# Patient Record
Sex: Female | Born: 1940 | Race: White | Hispanic: No | Smoking: Never smoker
Health system: Southern US, Community
[De-identification: ages and names within clinical notes are randomized; demographics above are authoritative.]

## PROBLEM LIST (undated history)

## (undated) DIAGNOSIS — I10 Essential (primary) hypertension: Secondary | ICD-10-CM

## (undated) DIAGNOSIS — K219 Gastro-esophageal reflux disease without esophagitis: Secondary | ICD-10-CM

## (undated) DIAGNOSIS — E079 Disorder of thyroid, unspecified: Secondary | ICD-10-CM

## (undated) HISTORY — PX: ABDOMINAL HYSTERECTOMY: SHX81

## (undated) HISTORY — PX: ABDOMINAL SURGERY: SHX537

## (undated) HISTORY — PX: APPENDECTOMY: SHX54

## (undated) HISTORY — PX: COLON SURGERY: SHX602

---

## 2011-02-11 ENCOUNTER — Encounter (HOSPITAL_BASED_OUTPATIENT_CLINIC_OR_DEPARTMENT_OTHER): Payer: Self-pay | Admitting: *Deleted

## 2011-02-11 ENCOUNTER — Emergency Department (INDEPENDENT_AMBULATORY_CARE_PROVIDER_SITE_OTHER): Payer: Medicare Other

## 2011-02-11 ENCOUNTER — Emergency Department (HOSPITAL_BASED_OUTPATIENT_CLINIC_OR_DEPARTMENT_OTHER)
Admission: EM | Admit: 2011-02-11 | Discharge: 2011-02-11 | Disposition: A | Discharge status: Home / Self Care | Payer: Medicare Other | Attending: Emergency Medicine | Admitting: Emergency Medicine

## 2011-02-11 DIAGNOSIS — K219 Gastro-esophageal reflux disease without esophagitis: Secondary | ICD-10-CM | POA: Insufficient documentation

## 2011-02-11 DIAGNOSIS — X58XXXA Exposure to other specified factors, initial encounter: Secondary | ICD-10-CM

## 2011-02-11 DIAGNOSIS — IMO0001 Reserved for inherently not codable concepts without codable children: Secondary | ICD-10-CM

## 2011-02-11 DIAGNOSIS — M7989 Other specified soft tissue disorders: Secondary | ICD-10-CM

## 2011-02-11 DIAGNOSIS — M20019 Mallet finger of unspecified finger(s): Secondary | ICD-10-CM | POA: Insufficient documentation

## 2011-02-11 DIAGNOSIS — I10 Essential (primary) hypertension: Secondary | ICD-10-CM | POA: Insufficient documentation

## 2011-02-11 DIAGNOSIS — S62639A Displaced fracture of distal phalanx of unspecified finger, initial encounter for closed fracture: Secondary | ICD-10-CM

## 2011-02-11 HISTORY — DX: Gastro-esophageal reflux disease without esophagitis: K21.9

## 2011-02-11 HISTORY — DX: Essential (primary) hypertension: I10

## 2011-02-11 NOTE — ED Provider Notes (Addendum)
History     CSN: [id number]  Arrival date & time 02/11/11  2018   First MD Initiated Contact with Patient 02/11/11 2027      Chief Complaint  Patient presents with  . Finger Injury    (Consider location/radiation/quality/duration/timing/severity/associated sxs/prior treatment) HPI Complains of pain at left ring finger,DIP joint occurred one hour ago when your became twisted when the wind blew while she was carrying a trash can. Pain is moderate worse with movement or palpation nonradiating no other injury no other associated symptoms treated with Advil with partial relief Past Medical History  Diagnosis Date  . Hypertension   . GERD (gastroesophageal reflux disease)    Hypothyroid Past Surgical History  Procedure Date  . Abdominal surgery   . Abdominal hysterectomy   . Appendectomy     History reviewed. No pertinent family history.  History  Substance Use Topics  . Smoking status: Never Smoker   . Smokeless tobacco: Not on file  . Alcohol Use: No    OB History    Grav Para Term Preterm Abortions TAB SAB Ect Mult Living                  Review of Systems  Constitutional: Negative.   Musculoskeletal: Positive for back pain.       Chronic back pain, pain at DIP joint  Skin: Negative.   Neurological: Negative.     Allergies  Review of patient's allergies indicates no known allergies.  Home Medications   Current Outpatient Rx  Name Route Sig Dispense Refill  . FUROSEMIDE 40 MG PO TABS Oral Take 40 mg by mouth daily.    Marland Kitchen LEVOTHYROXINE SODIUM 150 MCG PO TABS Oral Take 150 mcg by mouth daily.    Marland Kitchen METOPROLOL TARTRATE 25 MG PO TABS Oral Take 25 mg by mouth 2 (two) times daily.      BP 152/76  Pulse 78  Temp(Src) 99.6 F (37.6 C) (Oral)  Resp 16  Ht 5\' 6"  (1.676 m)  Wt 200 lb (90.719 kg)  BMI 32.28 kg/m2  SpO2 100%  Physical Exam  Nursing note and vitals reviewed. Constitutional: She appears well-developed and well-nourished. No distress.  HENT:    Head: Normocephalic and atraumatic.  Eyes: EOM are normal.  Neck: Neck supple.  Cardiovascular: Normal rate.   Pulmonary/Chest: Effort normal.  Abdominal: She exhibits no distension.  Musculoskeletal:       Left upper extremity: Ring finger with mallet deformity, otherwise atraumatic skin intact all fingers with good capillary refill, neurovascularly intact; all other extremities atraumatic neurovascularly intact    ED Course  Procedures (including critical care time) Declines pain medicine in the ED, or rx for pain med Labs Reviewed - No data to display No results found.  DIP joint splinted in extension by [employer]. Splint is comfortable for patient No diagnosis found.  No results found for this or any previous visit. Dg Finger Ring Left  02/11/2011  *RADIOLOGY REPORT*  Clinical Data: Question injury of the finger.  LEFT RING FINGER 2+V  Comparison: None.  Findings: There is an avulsion of the dorsal base of the terminal phalanx of the ring finger compatible with mallet finger injury. Soft tissue swelling is present over the distal aspect of the ring finger.  Fracture is displaced little over 1 mm.  IMPRESSION: Dorsal base of the terminal phalanx fracture compatible with mallet finger.  Original Report Authenticated By: Andreas Newport, M.D.     MDM  Dr. Melvyn Novas contacted. Plan followup  office next week Diagnosis mallet finger left fourth ring finger with fracture        Doug Sou, MD 02/11/11 2103  Doug Sou, MD 02/11/11 2103

## 2011-02-11 NOTE — ED Notes (Signed)
I placed a call for ortho (hand) per Dr. Ethelda Chick, Dr. Orlan Leavens is on call.

## 2011-02-11 NOTE — ED Notes (Signed)
Pt c/o left ring finger injury x 1 hr

## 2019-03-22 ENCOUNTER — Emergency Department (HOSPITAL_BASED_OUTPATIENT_CLINIC_OR_DEPARTMENT_OTHER): Payer: Medicare Other

## 2019-03-22 ENCOUNTER — Other Ambulatory Visit: Payer: Self-pay

## 2019-03-22 ENCOUNTER — Emergency Department (HOSPITAL_BASED_OUTPATIENT_CLINIC_OR_DEPARTMENT_OTHER)
Admission: EM | Admit: 2019-03-22 | Discharge: 2019-03-22 | Disposition: A | Discharge status: Home / Self Care | Payer: Medicare Other | Attending: Emergency Medicine | Admitting: Emergency Medicine

## 2019-03-22 DIAGNOSIS — S01111A Laceration without foreign body of right eyelid and periocular area, initial encounter: Secondary | ICD-10-CM | POA: Diagnosis not present

## 2019-03-22 DIAGNOSIS — Z7982 Long term (current) use of aspirin: Secondary | ICD-10-CM | POA: Diagnosis not present

## 2019-03-22 DIAGNOSIS — Y999 Unspecified external cause status: Secondary | ICD-10-CM | POA: Diagnosis not present

## 2019-03-22 DIAGNOSIS — Y92009 Unspecified place in unspecified non-institutional (private) residence as the place of occurrence of the external cause: Secondary | ICD-10-CM | POA: Diagnosis not present

## 2019-03-22 DIAGNOSIS — Y9301 Activity, walking, marching and hiking: Secondary | ICD-10-CM | POA: Insufficient documentation

## 2019-03-22 DIAGNOSIS — Z79899 Other long term (current) drug therapy: Secondary | ICD-10-CM | POA: Insufficient documentation

## 2019-03-22 DIAGNOSIS — H05231 Hemorrhage of right orbit: Secondary | ICD-10-CM

## 2019-03-22 DIAGNOSIS — W01190A Fall on same level from slipping, tripping and stumbling with subsequent striking against furniture, initial encounter: Secondary | ICD-10-CM | POA: Diagnosis not present

## 2019-03-22 DIAGNOSIS — W19XXXA Unspecified fall, initial encounter: Secondary | ICD-10-CM

## 2019-03-22 MED ORDER — ACETAMINOPHEN 325 MG PO TABS
650.0000 mg | ORAL_TABLET | Freq: Once | ORAL | Status: AC
  Administered 2019-03-22: 650 mg via ORAL
  Filled 2019-03-22: qty 2

## 2019-03-22 MED ORDER — LIDOCAINE-EPINEPHRINE (PF) 2 %-1:200000 IJ SOLN
10.0000 mL | Freq: Once | INTRAMUSCULAR | Status: AC
  Administered 2019-03-22: 10 mL
  Filled 2019-03-22: qty 10

## 2019-03-22 MED ORDER — HYDROCODONE-ACETAMINOPHEN 5-325 MG PO TABS: 1.0000 | ORAL_TABLET | ORAL | 0 refills | Status: DC | PRN

## 2019-03-22 NOTE — ED Triage Notes (Signed)
Per EMS:  Pt tripped and fell, hit corner of table to right eye.  Denies loc.  Not on blood thinners but takes aspirin.  Pt did admit to some back pain but is baseline to patient.  Pt did not admit to any other injury.  Pt has some swelling to right eye and bruising.  Pt also has laceration over right eye, bleeding controlled.  Denies blurry vision, denies dizziness.

## 2019-03-22 NOTE — ED Provider Notes (Signed)
[facility] EMERGENCY DEPARTMENT Provider Note   CSN: [id number] Arrival date & time: 03/22/19  1138     History Chief Complaint  Patient presents with  . Fall    Madison Ayala is a 79 y.o. female.  Pt presents to the ED today with a fall.  Pt tripped, fell, and hit the corner of the table with her right eye.  She denies blurry vision.  She denies loc.  She denies any other injury.        Past Medical History:  Diagnosis Date  . GERD (gastroesophageal reflux disease)   . Hypertension     There are no problems to display for this patient.   Past Surgical History:  Procedure Laterality Date  . ABDOMINAL HYSTERECTOMY    . ABDOMINAL SURGERY    . APPENDECTOMY       OB History   No obstetric history on file.     No family history on file.  Social History   Tobacco Use  . Smoking status: Never Smoker  Substance Use Topics  . Alcohol use: No  . Drug use: No    Home Medications Prior to Admission medications   Medication Sig Start Date End Date Taking? Authorizing Provider  aspirin 325 MG EC tablet Take 325 mg by mouth at bedtime.    [provider]  Calcium Carbonate-Vitamin D (CALTRATE 600+D) 600-400 MG-UNIT per tablet Take 1 tablet by mouth 2 (two) times daily.    [provider]  Calcium-Vitamin D-Vitamin K (VIACTIV) 500-100-40 MG-UNT-MCG CHEW Chew 3 tablets by mouth daily.    [provider]  cyclobenzaprine (FLEXERIL) 10 MG tablet Take 10 mg by mouth 3 (three) times daily.    [provider]  fenofibrate 160 MG tablet Take 160 mg by mouth at bedtime.    [provider]  furosemide (LASIX) 40 MG tablet Take 40 mg by mouth daily.    [provider]  HYDROcodone-acetaminophen (NORCO/VICODIN) 5-325 MG tablet Take 1 tablet by mouth every 4 (four) hours as needed. 03/22/19   Jacalyn Lefevre, MD  levothyroxine (SYNTHROID, LEVOTHROID) 150 MCG tablet Take 150 mcg by mouth daily.    [provider]  lidocaine (LIDODERM) 5 % Place 1-3 patches onto the skin daily. Remove & Discard patch within 12 hours or as directed by MD    [provider]  lisinopril (PRINIVIL,ZESTRIL) 5 MG tablet Take 5 mg by mouth at bedtime.    [provider]  methadone (DOLOPHINE) 10 MG tablet Take 10 mg by mouth 2 (two) times daily.    [provider]  metoprolol tartrate (LOPRESSOR) 25 MG tablet Take 25 mg by mouth 2 (two) times daily.    [provider]  Multiple Vitamin (MULITIVITAMIN WITH MINERALS) TABS Take 1 tablet by mouth daily.    [provider]  omeprazole (PRILOSEC) 20 MG capsule Take 20 mg by mouth at bedtime.    [provider]  sertraline (ZOLOFT) 100 MG tablet Take 100 mg by mouth daily.    [provider]  temazepam (RESTORIL) 30 MG capsule Take 30 mg by mouth at bedtime.    [provider]    Allergies    Patient has no known allergies.  Review of Systems   Review of Systems  Eyes:       Throbbing around right eye  Skin: Positive for wound.  All other systems reviewed and are negative.   Physical Exam Updated Vital Signs BP (!) 141/53 (  BP Location: Right Arm)   Pulse 62   Temp 98.5 F (36.9 C)   Resp 16   Ht 5\' 3"  (1.6 m)   Wt 99.8 kg   SpO2 98%   BMI 38.97 kg/m   Physical Exam  ED Results / Procedures / Treatments   Labs (all labs ordered are listed, but only abnormal results are displayed) Labs Reviewed - No data to display  EKG None  Radiology CT Head Wo Contrast  Result Date: 03/22/2019 CLINICAL DATA:  Trauma, fall and hit table to corner of right eye. EXAM: CT HEAD WITHOUT CONTRAST CT MAXILLOFACIAL WITHOUT CONTRAST CT CERVICAL SPINE WITHOUT CONTRAST TECHNIQUE: Multidetector CT imaging of the head, cervical spine, and maxillofacial structures were performed using the standard protocol without intravenous contrast. Multiplanar CT image reconstructions of the cervical spine and  maxillofacial structures were also generated. COMPARISON:  MRI of the head of 06/09/2018 FINDINGS: CT HEAD FINDINGS Brain: No evidence of acute infarction, hemorrhage, hydrocephalus, extra-axial collection or mass lesion/mass effect. Signs of atrophy and chronic microvascular ischemic change in deep white matter as before. Vascular: No hyperdense vessel or unexpected calcification. Skull: Normal. Negative for fracture or focal lesion. Other: Preseptal swelling over the right orbit. See maxillofacial CT for further detail. Dressing in place over the right orbit. Small amounts of gas in the soft tissues likely in the eyelid and supraorbital region likely reflecting laceration. Small metallic foreign body superficial to the skin surface along the right cheek. CT MAXILLOFACIAL FINDINGS Osseous: No signs of fracture with preseptal swelling and laceration as indicated above. Orbits: Preseptal swelling and periorbital hematoma in the soft tissues of the right face. No retro bulbar stranding. Sinuses: Clear. Soft tissues: Preseptal swelling and superficial, subcutaneous hematoma associated with the injury to the right periorbital region. CT CERVICAL SPINE FINDINGS Alignment: Mild reversal of cervical lordosis in the upper cervical spine, associated with degenerative changes. Not changed since prior examination is from 2013 Skull base and vertebrae: No acute fracture. No primary bone lesion or focal pathologic process. Soft tissues and spinal canal: No prevertebral fluid or swelling. No visible canal hematoma. Retropharyngeal course of carotid vasculature similar to previous studies. Disc levels: Multilevel degenerative change throughout the cervical spine greatest at the midportion of the cervical spine but with near complete disc space narrowing seen at multiple levels, C3-C4, C4-C5, C5-C6 and C6-C7 associated with small anterior disc osteophyte complexes and with posterior uncovertebral degenerative changes with varying  degrees of foraminal narrowing Upper chest: Negative. Other: None IMPRESSION: 1. No CT evidence for acute intracranial process. 2. No CT evidence for acute traumatic fracture of the cervical spine. 3. 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 4. Preseptal swelling and periorbital hematoma in the right face. No evidence for underlying fracture. Small metallic foreign body superficial to the skin surface along the right cheek. 5. Retropharyngeal course of carotid vasculature similar to prior studies. Electronically Signed   By: Donzetta KohutGeoffrey  Wile M.D.   On: 03/22/2019 12:51   CT Cervical Spine Wo Contrast  Result Date: 03/22/2019 CLINICAL DATA:  Trauma, fall and hit table to corner of right eye. EXAM: CT HEAD WITHOUT CONTRAST CT MAXILLOFACIAL WITHOUT CONTRAST CT CERVICAL SPINE WITHOUT CONTRAST TECHNIQUE: Multidetector CT imaging of the head, cervical spine, and maxillofacial structures were performed using the standard protocol without intravenous contrast. Multiplanar CT image reconstructions of the cervical spine and maxillofacial structures were also generated. COMPARISON:  MRI of the head of 06/09/2018 FINDINGS: CT HEAD FINDINGS Brain: No evidence of  acute infarction, hemorrhage, hydrocephalus, extra-axial collection or mass lesion/mass effect. Signs of atrophy and chronic microvascular ischemic change in deep white matter as before. Vascular: No hyperdense vessel or unexpected calcification. Skull: Normal. Negative for fracture or focal lesion. Other: Preseptal swelling over the right orbit. See maxillofacial CT for further detail. Dressing in place over the right orbit. Small amounts of gas in the soft tissues likely in the eyelid and supraorbital region likely reflecting laceration. Small metallic foreign body superficial to the skin surface along the right cheek. CT MAXILLOFACIAL FINDINGS Osseous: No signs of fracture with preseptal swelling and laceration as indicated above. Orbits: Preseptal  swelling and periorbital hematoma in the soft tissues of the right face. No retro bulbar stranding. Sinuses: Clear. Soft tissues: Preseptal swelling and superficial, subcutaneous hematoma associated with the injury to the right periorbital region. CT CERVICAL SPINE FINDINGS Alignment: Mild reversal of cervical lordosis in the upper cervical spine, associated with degenerative changes. Not changed since prior examination is from 2013 Skull base and vertebrae: No acute fracture. No primary bone lesion or focal pathologic process. Soft tissues and spinal canal: No prevertebral fluid or swelling. No visible canal hematoma. Retropharyngeal course of carotid vasculature similar to previous studies. Disc levels: Multilevel degenerative change throughout the cervical spine greatest at the midportion of the cervical spine but with near complete disc space narrowing seen at multiple levels, C3-C4, C4-C5, C5-C6 and C6-C7 associated with small anterior disc osteophyte complexes and with posterior uncovertebral degenerative changes with varying degrees of foraminal narrowing Upper chest: Negative. Other: None IMPRESSION: 1. No CT evidence for acute intracranial process. 2. No CT evidence for acute traumatic fracture of the cervical spine. 3. 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 4. Preseptal swelling and periorbital hematoma in the right face. No evidence for underlying fracture. Small metallic foreign body superficial to the skin surface along the right cheek. 5. Retropharyngeal course of carotid vasculature similar to prior studies. Electronically Signed   By: Donzetta Kohut M.D.   On: 03/22/2019 12:51   CT Maxillofacial Wo Contrast  Result Date: 03/22/2019 CLINICAL DATA:  Trauma, fall and hit table to corner of right eye. EXAM: CT HEAD WITHOUT CONTRAST CT MAXILLOFACIAL WITHOUT CONTRAST CT CERVICAL SPINE WITHOUT CONTRAST TECHNIQUE: Multidetector CT imaging of the head, cervical spine, and maxillofacial  structures were performed using the standard protocol without intravenous contrast. Multiplanar CT image reconstructions of the cervical spine and maxillofacial structures were also generated. COMPARISON:  MRI of the head of 06/09/2018 FINDINGS: CT HEAD FINDINGS Brain: No evidence of acute infarction, hemorrhage, hydrocephalus, extra-axial collection or mass lesion/mass effect. Signs of atrophy and chronic microvascular ischemic change in deep white matter as before. Vascular: No hyperdense vessel or unexpected calcification. Skull: Normal. Negative for fracture or focal lesion. Other: Preseptal swelling over the right orbit. See maxillofacial CT for further detail. Dressing in place over the right orbit. Small amounts of gas in the soft tissues likely in the eyelid and supraorbital region likely reflecting laceration. Small metallic foreign body superficial to the skin surface along the right cheek. CT MAXILLOFACIAL FINDINGS Osseous: No signs of fracture with preseptal swelling and laceration as indicated above. Orbits: Preseptal swelling and periorbital hematoma in the soft tissues of the right face. No retro bulbar stranding. Sinuses: Clear. Soft tissues: Preseptal swelling and superficial, subcutaneous hematoma associated with the injury to the right periorbital region. CT CERVICAL SPINE FINDINGS Alignment: Mild reversal of cervical lordosis in the upper cervical spine, associated with degenerative changes. Not changed  since prior examination is from 2013 Skull base and vertebrae: No acute fracture. No primary bone lesion or focal pathologic process. Soft tissues and spinal canal: No prevertebral fluid or swelling. No visible canal hematoma. Retropharyngeal course of carotid vasculature similar to previous studies. Disc levels: Multilevel degenerative change throughout the cervical spine greatest at the midportion of the cervical spine but with near complete disc space narrowing seen at multiple levels, C3-C4,  C4-C5, C5-C6 and C6-C7 associated with small anterior disc osteophyte complexes and with posterior uncovertebral degenerative changes with varying degrees of foraminal narrowing Upper chest: Negative. Other: None IMPRESSION: 1. No CT evidence for acute intracranial process. 2. No CT evidence for acute traumatic fracture of the cervical spine. 3. 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 4. Preseptal swelling and periorbital hematoma in the right face. No evidence for underlying fracture. Small metallic foreign body superficial to the skin surface along the right cheek. 5. Retropharyngeal course of carotid vasculature similar to prior studies. Electronically Signed   By: Zetta Bills M.D.   On: 03/22/2019 12:51    Procedures .Marland KitchenLaceration Repair  Date/Time: 03/22/2019 1:10 PM Performed by: Isla Pence, MD Authorized by: Isla Pence, MD   Consent:    Consent obtained:  Verbal   Consent given by:  Patient   Risks discussed:  Pain and poor cosmetic result   Alternatives discussed:  No treatment Anesthesia (see MAR for exact dosages):    Anesthesia method:  Local infiltration   Local anesthetic:  Lidocaine 1% WITH epi Laceration details:    Location:  Face   Face location:  R upper eyelid   Extent:  Superficial   Length (cm):  2 Repair type:    Repair type:  Intermediate Pre-procedure details:    Preparation:  Patient was prepped and draped in usual sterile fashion Exploration:    Hemostasis achieved with:  Epinephrine   Contaminated: no   Treatment:    Area cleansed with:  Betadine and saline Skin repair:    Repair method:  Sutures   Suture size:  6-0   Suture material:  Prolene   Number of sutures:  6 Approximation:    Approximation:  Close Post-procedure details:    Dressing:  Antibiotic ointment   Patient tolerance of procedure:  Tolerated well, no immediate complications Comments:     Pt's skin was very thin and there was some mild tearing of the skin  with sutures.   (including critical care time)  Medications Ordered in ED Medications  lidocaine-EPINEPHrine (XYLOCAINE W/EPI) 2 %-1:200000 (PF) injection 10 mL (has no administration in time range)  acetaminophen (TYLENOL) tablet 650 mg (650 mg Oral Given 03/22/19 1214)    ED Course  I have reviewed the triage vital signs and the nursing notes.  Pertinent labs & imaging results that were available during my care of the patient were reviewed by me and considered in my medical decision making (see chart for details).    MDM Rules/Calculators/A&P                      Pt is feeling better.  She is stable for d/c.  Return if worse.  Final Clinical Impression(s) / ED Diagnoses Final diagnoses:  Fall, initial encounter  Periorbital hematoma of right eye  Right eyelid laceration, initial encounter    Rx / DC Orders ED Discharge Orders         Ordered    HYDROcodone-acetaminophen (NORCO/VICODIN) 5-325 MG tablet  Every 4 hours  PRN     03/22/19 1310           Jacalyn Lefevre, MD 03/22/19 1312

## 2019-03-28 ENCOUNTER — Encounter (HOSPITAL_BASED_OUTPATIENT_CLINIC_OR_DEPARTMENT_OTHER): Payer: Self-pay | Admitting: Emergency Medicine

## 2019-03-28 ENCOUNTER — Other Ambulatory Visit: Payer: Self-pay

## 2019-03-28 ENCOUNTER — Emergency Department (HOSPITAL_BASED_OUTPATIENT_CLINIC_OR_DEPARTMENT_OTHER)
Admission: EM | Admit: 2019-03-28 | Discharge: 2019-03-28 | Disposition: A | Discharge status: Home / Self Care | Payer: Medicare Other | Attending: Emergency Medicine | Admitting: Emergency Medicine

## 2019-03-28 DIAGNOSIS — Z23 Encounter for immunization: Secondary | ICD-10-CM | POA: Insufficient documentation

## 2019-03-28 DIAGNOSIS — I10 Essential (primary) hypertension: Secondary | ICD-10-CM | POA: Insufficient documentation

## 2019-03-28 DIAGNOSIS — S01111D Laceration without foreign body of right eyelid and periocular area, subsequent encounter: Secondary | ICD-10-CM | POA: Diagnosis not present

## 2019-03-28 DIAGNOSIS — X58XXXD Exposure to other specified factors, subsequent encounter: Secondary | ICD-10-CM | POA: Diagnosis not present

## 2019-03-28 DIAGNOSIS — Z5189 Encounter for other specified aftercare: Secondary | ICD-10-CM

## 2019-03-28 DIAGNOSIS — Z4802 Encounter for removal of sutures: Secondary | ICD-10-CM

## 2019-03-28 DIAGNOSIS — Z79899 Other long term (current) drug therapy: Secondary | ICD-10-CM | POA: Diagnosis not present

## 2019-03-28 DIAGNOSIS — Z7982 Long term (current) use of aspirin: Secondary | ICD-10-CM | POA: Diagnosis not present

## 2019-03-28 MED ORDER — TETANUS-DIPHTH-ACELL PERTUSSIS 5-2.5-18.5 LF-MCG/0.5 IM SUSP
0.5000 mL | Freq: Once | INTRAMUSCULAR | Status: AC
  Administered 2019-03-28: 14:00:00 0.5 mL via INTRAMUSCULAR
  Filled 2019-03-28: qty 0.5

## 2019-03-28 NOTE — ED Notes (Signed)
Pt discharged to home. Discharge instructions have been discussed with patient and/or family members. Pt verbally acknowledges understanding d/c instructions. 

## 2019-03-28 NOTE — ED Triage Notes (Signed)
Pt presents requesting to have sutures removed from right eyelid. Pt reports aspirin,

## 2019-03-28 NOTE — Discharge Instructions (Addendum)
It was our pleasure to provide your ER care today - we hope that you feel better.  Fall precautions.  Return to ER if worse, new symptoms, spreading redness, pus from wound, fevers, new or severe pain, weak/fainting, or other concern.

## 2019-03-28 NOTE — ED Provider Notes (Addendum)
[facility] EMERGENCY DEPARTMENT Provider Note   CSN: [id number] Arrival date & time: 03/28/19  1313     History Chief Complaint  Patient presents with  . Suture / Staple Removal    Madison Ayala is a 79 y.o. female.  Patient indicates here for suture removal. Had sutures placed right eyelid 6 days ago. No problems w wound. No eye pain or change in vision. No spreading redness or pus. No fever.   The history is provided by the patient.       Past Medical History:  Diagnosis Date  . GERD (gastroesophageal reflux disease)   . Hypertension     There are no problems to display for this patient.   Past Surgical History:  Procedure Laterality Date  . ABDOMINAL HYSTERECTOMY    . ABDOMINAL SURGERY    . APPENDECTOMY    . COLON SURGERY       OB History   No obstetric history on file.     History reviewed. No pertinent family history.  Social History   Tobacco Use  . Smoking status: Never Smoker  Substance Use Topics  . Alcohol use: No  . Drug use: No    Home Medications Prior to Admission medications   Medication Sig Start Date End Date Taking? Authorizing Provider  aspirin 325 MG EC tablet Take 325 mg by mouth at bedtime.    [provider]  Calcium Carbonate-Vitamin D (CALTRATE 600+D) 600-400 MG-UNIT per tablet Take 1 tablet by mouth 2 (two) times daily.    [provider]  Calcium-Vitamin D-Vitamin K (VIACTIV) 132-440-10 MG-UNT-MCG CHEW Chew 3 tablets by mouth daily.    [provider]  cyclobenzaprine (FLEXERIL) 10 MG tablet Take 10 mg by mouth 3 (three) times daily.    [provider]  fenofibrate 160 MG tablet Take 160 mg by mouth at bedtime.    [provider]  furosemide (LASIX) 40 MG tablet Take 40 mg by mouth daily.    [provider]  HYDROcodone-acetaminophen (NORCO/VICODIN) 5-325 MG tablet Take 1 tablet by mouth every 4 (four) hours as needed. 03/22/19   Isla Pence, MD    levothyroxine (SYNTHROID, LEVOTHROID) 150 MCG tablet Take 150 mcg by mouth daily.    [provider]  lidocaine (LIDODERM) 5 % Place 1-3 patches onto the skin daily. Remove & Discard patch within 12 hours or as directed by MD    [provider]  lisinopril (PRINIVIL,ZESTRIL) 5 MG tablet Take 5 mg by mouth at bedtime.    [provider]  methadone (DOLOPHINE) 10 MG tablet Take 10 mg by mouth 2 (two) times daily.    [provider]  metoprolol tartrate (LOPRESSOR) 25 MG tablet Take 25 mg by mouth 2 (two) times daily.    [provider]  Multiple Vitamin (MULITIVITAMIN WITH MINERALS) TABS Take 1 tablet by mouth daily.    [provider]  omeprazole (PRILOSEC) 20 MG capsule Take 20 mg by mouth at bedtime.    [provider]  sertraline (ZOLOFT) 100 MG tablet Take 100 mg by mouth daily.    [provider]  temazepam (RESTORIL) 30 MG capsule Take 30 mg by mouth at bedtime.    [provider]    Allergies    Patient has no known allergies.  Review of Systems   Review of Systems  Constitutional: Negative for fever.  Eyes: Negative for visual disturbance.  Skin: Positive for wound.  Neurological: Negative for headaches.  Physical Exam Updated Vital Signs BP 132/60 (BP Location: Right Arm)   Pulse 63   Temp 99.3 F (37.4 C) (Oral)   Resp 16   Ht 1.6 m (5\' 3" )   Wt 90.7 kg   SpO2 96%   BMI 35.43 kg/m   Physical Exam Vitals and nursing note reviewed.  Constitutional:      Appearance: She is well-developed.  HENT:     Head:     Comments: Bruising about right eye. Healing wound to right eyelid with sutures in place. No cellulitis or purulent drainage.     Nose: Nose normal.     Mouth/Throat:     Mouth: Mucous membranes are moist.  Eyes:     General: No scleral icterus.    Pupils: Pupils are equal, round, and reactive to light.  Neck:     Trachea: No tracheal deviation.  Pulmonary:     Effort:  Pulmonary effort is normal. No respiratory distress.  Musculoskeletal:        General: No swelling.     Cervical back: Normal range of motion. No muscular tenderness.  Skin:    General: Skin is warm and dry.     Findings: No rash.  Neurological:     Mental Status: She is alert.     Comments: Alert, speech normal.   Psychiatric:        Mood and Affect: Mood normal.     ED Results / Procedures / Treatments   Labs (all labs ordered are listed, but only abnormal results are displayed) Labs Reviewed - No data to display  EKG None  Radiology No results found.  Procedures Procedures (including critical care time)  Medications Ordered in ED Medications  Tdap (BOOSTRIX) injection 0.5 mL (has no administration in time range)    ED Course  I have reviewed the triage vital signs and the nursing notes.  Pertinent labs & imaging results that were available during my care of the patient were reviewed by me and considered in my medical decision making (see chart for details).    MDM Rules/Calculators/A&P                      Pt unsure of most recent tetanus - will update. Tetanus im.   Sutures removed by RN.  Reviewed nursing notes and prior charts for additional history. Prior imaging negative for acute process, hem or fx.   Pt appears stable for d/c.   Return precautions provided.     Final Clinical Impression(s) / ED Diagnoses Final diagnoses:  None    Rx / DC Orders ED Discharge Orders    None          , MD 03/28/19 1340

## 2021-09-02 IMAGING — CT CT MAXILLOFACIAL W/O CM
3 series · 14 of 47 positions shown, 16 images · non-contrast
Comparison: MRI of the head of 06/09/2018

CLINICAL DATA: Trauma, fall and hit table to corner of right eye.

EXAM:
CT HEAD WITHOUT CONTRAST
CT MAXILLOFACIAL WITHOUT CONTRAST
CT CERVICAL SPINE WITHOUT CONTRAST
TECHNIQUE: Multidetector CT imaging of the head, cervical spine, and
maxillofacial structures were performed using the standard protocol
without intravenous contrast. Multiplanar CT image reconstructions
of the cervical spine and maxillofacial structures were also
generated.

[Series 2: max soft · axial · 0.32mm/px · z∈[-254,-118]mm · 8 of 80 slices shown, 10 images]
[im 6/80  brain]
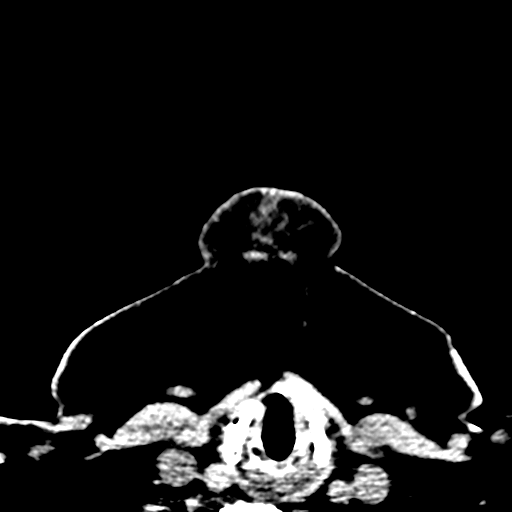
[im 6/80  bone]
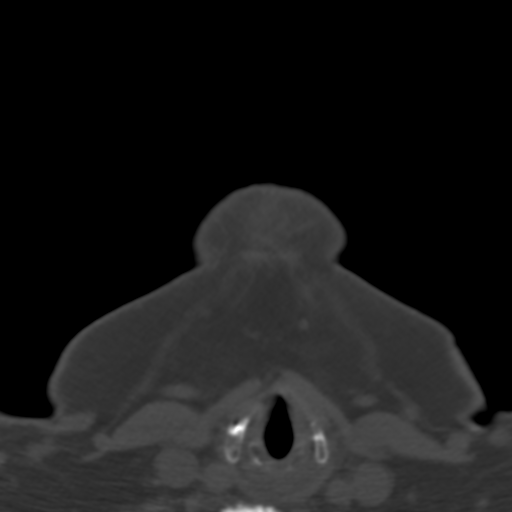
[im 17/80  bone]
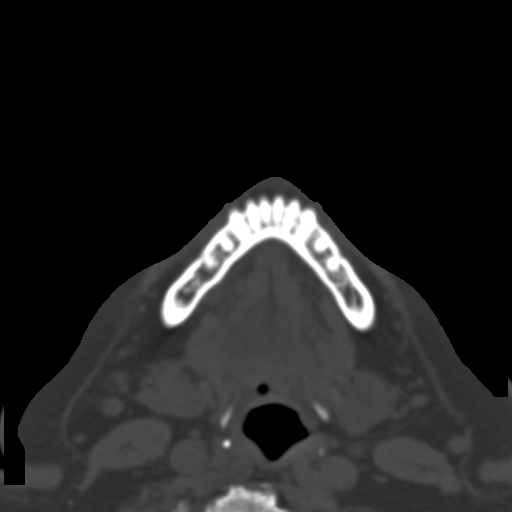
[im 25/80  bone]
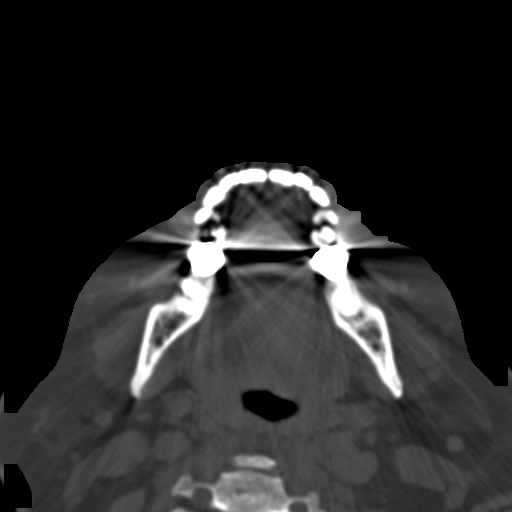
[im 36/80  bone]
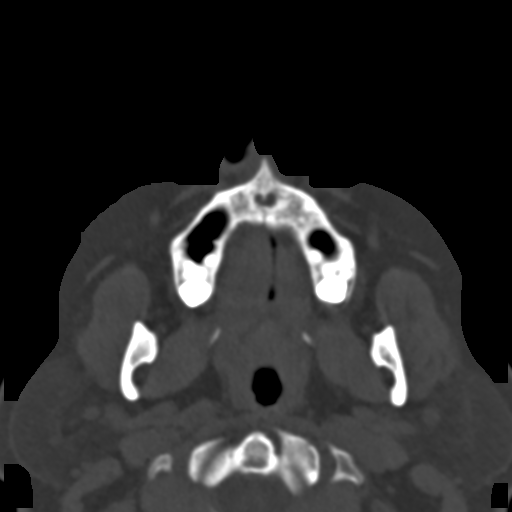
[im 44/80  brain]
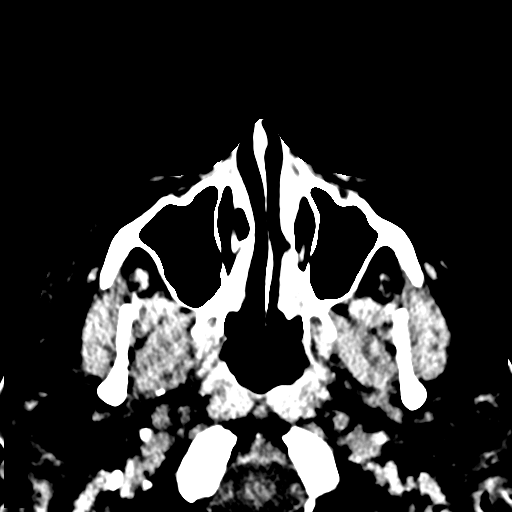
[im 44/80  bone]
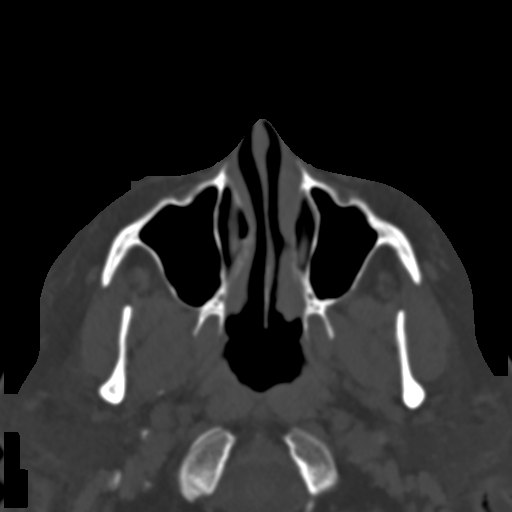
[im 55/80  bone]
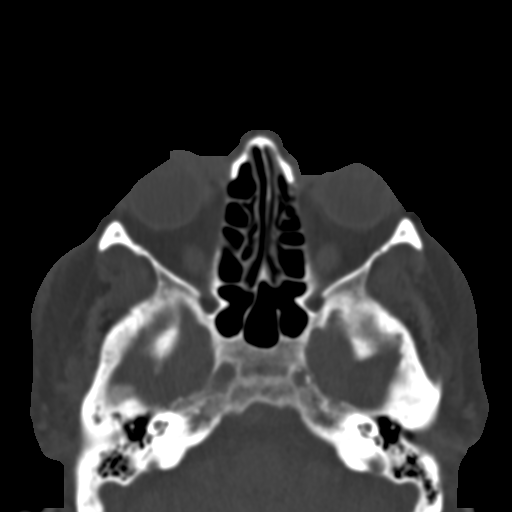
[im 63/80  bone]
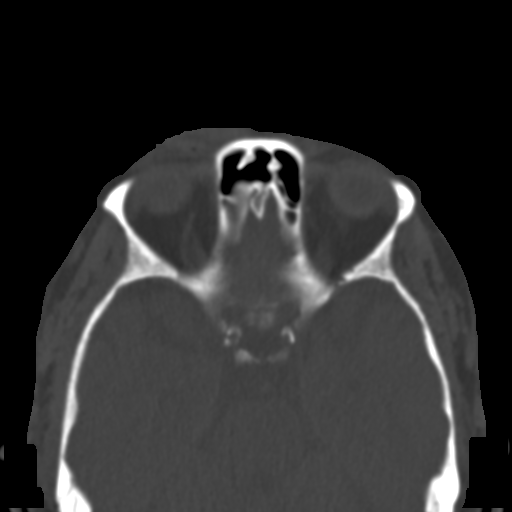
[im 74/80  bone]
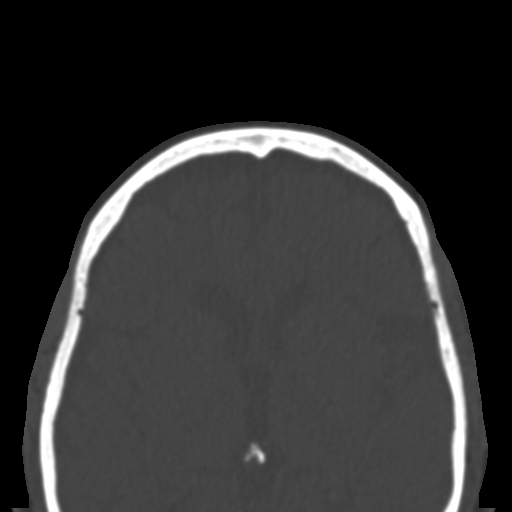

[Series 4: coronal soft · coronal · 0.32mm/px · 3 of 75 slices shown]
[im 25/75  bone]
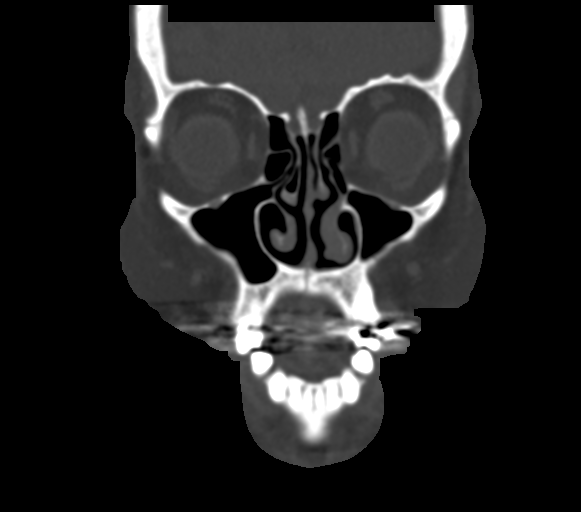
[im 33/75  bone]
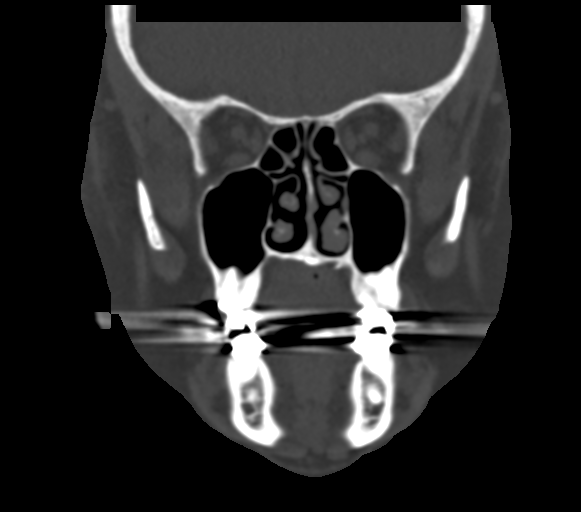
[im 42/75  bone]
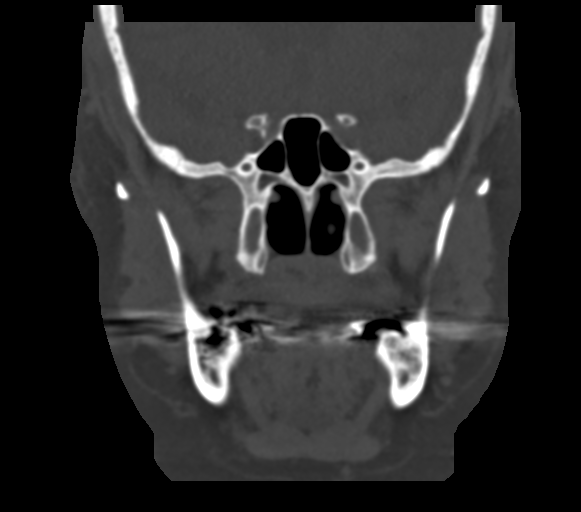

[Series 7: sagittal bone · sagittal · 0.32mm/px · 3 of 89 slices shown]
[im 30/89  bone]
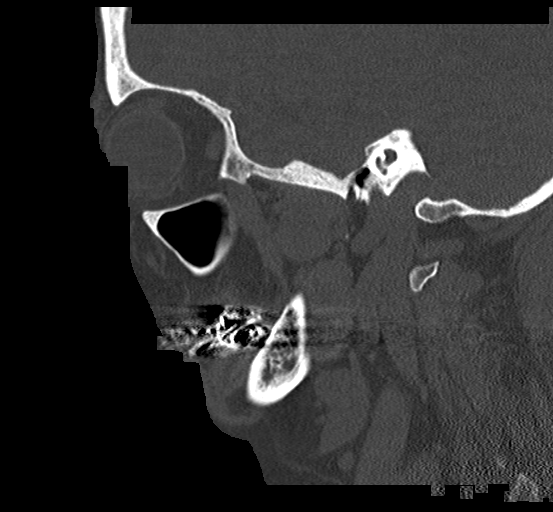
[im 45/89  bone]
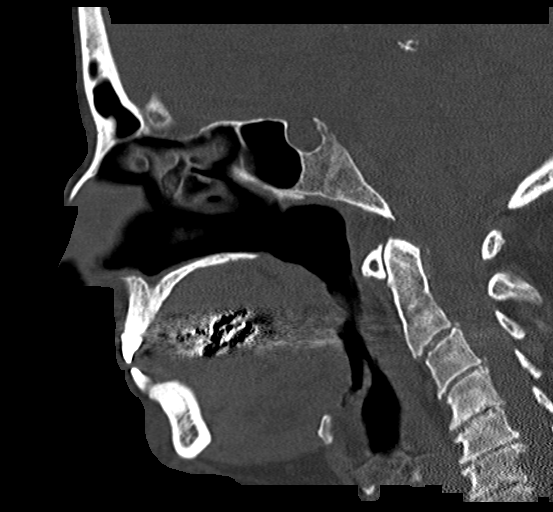
[im 59/89  bone]
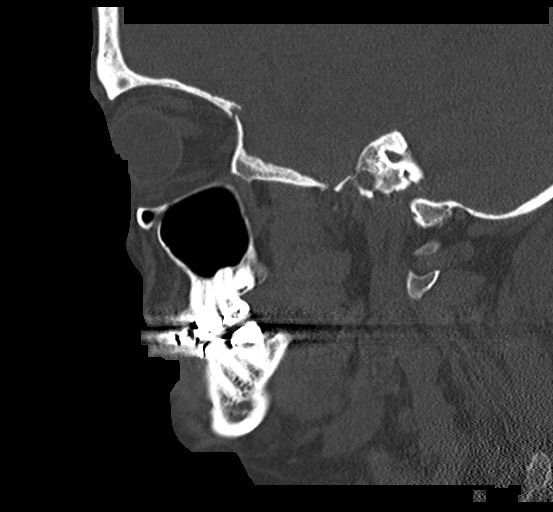

[14 of 47 positions shown; findings below may reference images not displayed]

FINDINGS: CT HEAD FINDINGS

Brain: No evidence of acute infarction, hemorrhage, hydrocephalus,
extra-axial collection or mass lesion/mass effect.

Signs of atrophy and chronic microvascular ischemic change in deep
white matter as before.

Vascular: No hyperdense vessel or unexpected calcification.

Skull: Normal. Negative for fracture or focal lesion.

Other: Preseptal swelling over the right orbit. See maxillofacial CT
for further detail. Dressing in place over the right orbit. Small
amounts of gas in the soft tissues likely in the eyelid and
supraorbital region likely reflecting laceration. Small metallic
foreign body superficial to the skin surface along the right cheek.

CT MAXILLOFACIAL FINDINGS

Osseous: No signs of fracture with preseptal swelling and laceration
as indicated above.

Orbits: Preseptal swelling and periorbital hematoma in the soft
tissues of the right face. No retro bulbar stranding.

Sinuses: Clear.

Soft tissues: Preseptal swelling and superficial, subcutaneous
hematoma associated with the injury to the right periorbital region.

CT CERVICAL SPINE FINDINGS

Alignment: Mild reversal of cervical lordosis in the upper cervical
spine, associated with degenerative changes. Not changed since prior
examination is from 6211

Skull base and vertebrae: No acute fracture. No primary bone lesion
or focal pathologic process.

Soft tissues and spinal canal: No prevertebral fluid or swelling. No
visible canal hematoma. Retropharyngeal course of carotid
vasculature similar to previous studies.

Disc levels: Multilevel degenerative change throughout the cervical
spine greatest at the midportion of the cervical spine but with near
complete disc space narrowing seen at multiple levels, C3-C4, C4-C5,
C5-C6 and C6-C7 associated with small anterior disc osteophyte
complexes and with posterior uncovertebral degenerative changes with
varying degrees of foraminal narrowing

Upper chest: Negative.

Other: None
IMPRESSION: 1. No CT evidence for acute intracranial process.
2. No CT evidence for acute traumatic fracture of the cervical
spine.
3. Multilevel degenerative change of the cervical spine.
4. Preseptal swelling and periorbital hematoma in the right face. No
evidence for underlying fracture. Small metallic foreign body
superficial to the skin surface along the right cheek.
5. Retropharyngeal course of carotid vasculature similar to prior
studies.

## 2021-09-02 IMAGING — CT CT CERVICAL SPINE W/O CM
3 of 4 series · 11 of 33 positions shown, 13 images · non-contrast
Comparison: MRI of the head of 06/09/2018

CLINICAL DATA: Trauma, fall and hit table to corner of right eye.

EXAM:
CT HEAD WITHOUT CONTRAST
CT MAXILLOFACIAL WITHOUT CONTRAST
CT CERVICAL SPINE WITHOUT CONTRAST
TECHNIQUE: Multidetector CT imaging of the head, cervical spine, and
maxillofacial structures were performed using the standard protocol
without intravenous contrast. Multiplanar CT image reconstructions
of the cervical spine and maxillofacial structures were also
generated.

[Series 4: sagittal bone · sagittal · 0.27mm/px · 5 of 74 slices shown, 6 images]
[im 25/74  bone]
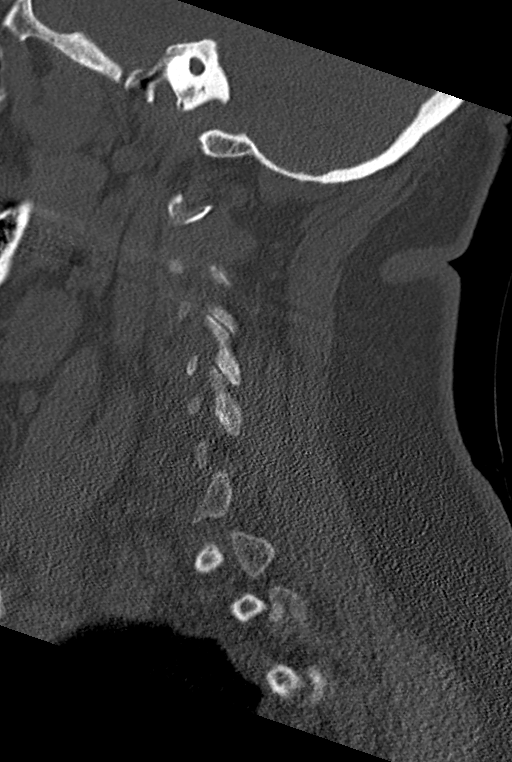
[im 31/74  bone]
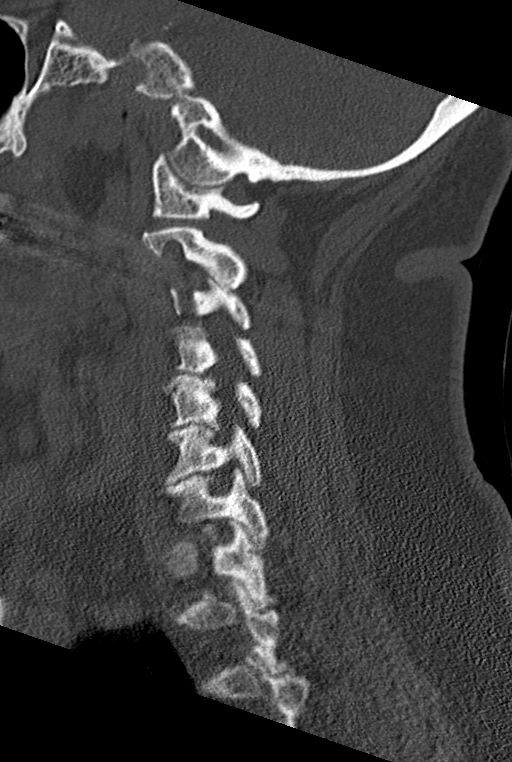
[im 37/74  soft-tissue]
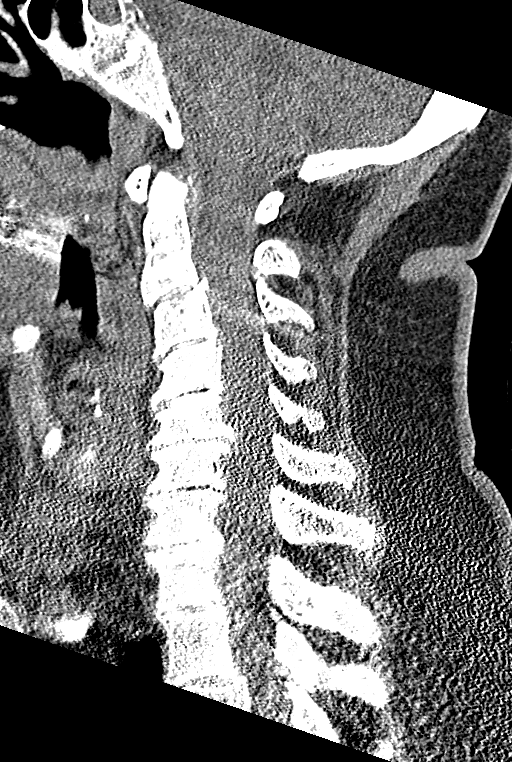
[im 37/74  bone]
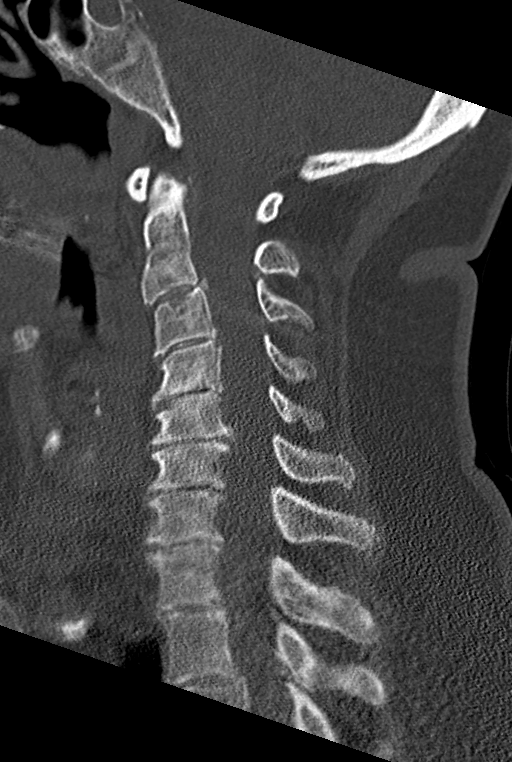
[im 43/74  bone]
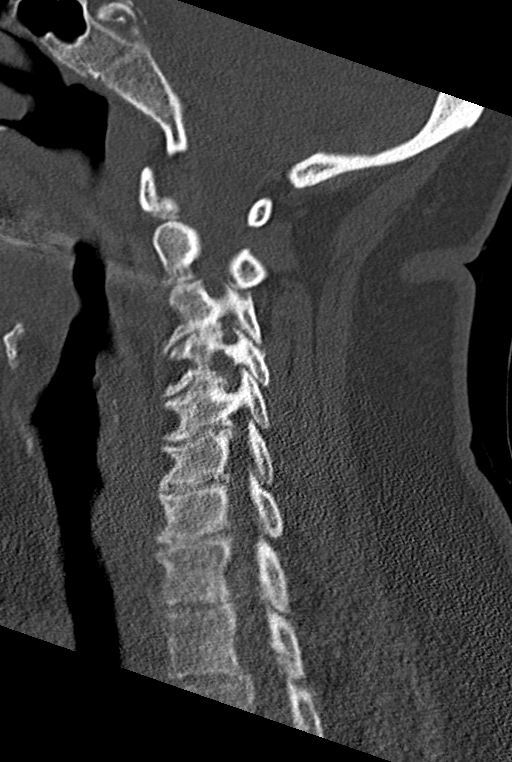
[im 49/74  bone]
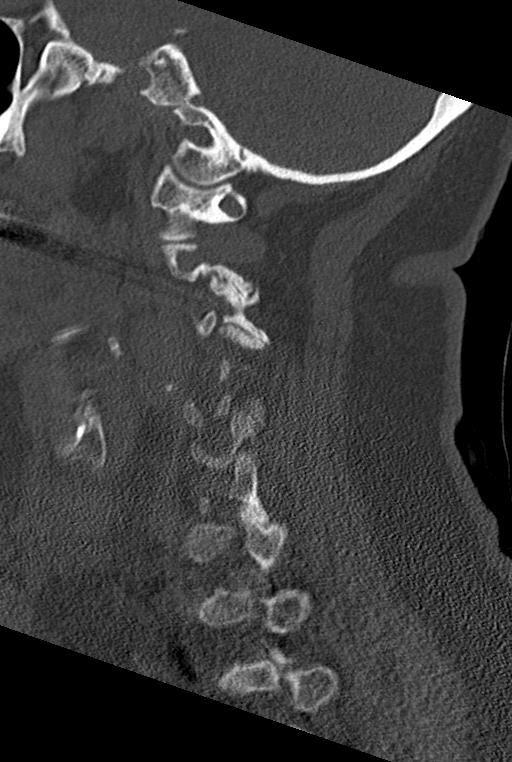

[Series 5: coronal bone · coronal · 0.28mm/px · 3 of 73 slices shown]
[im 15/73  bone]
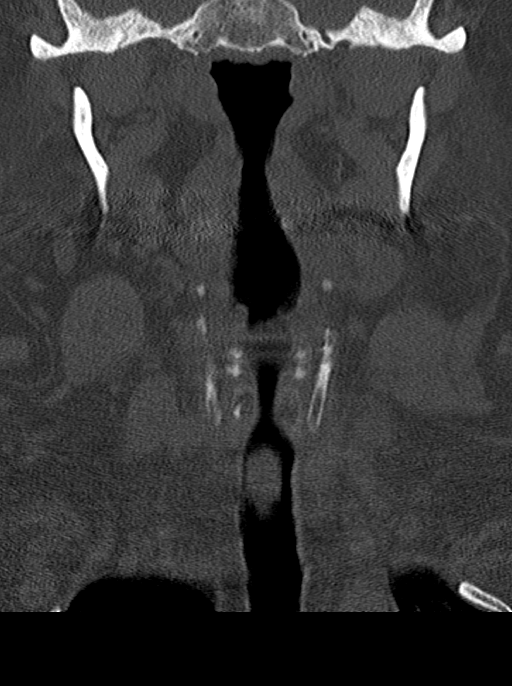
[im 29/73  bone]
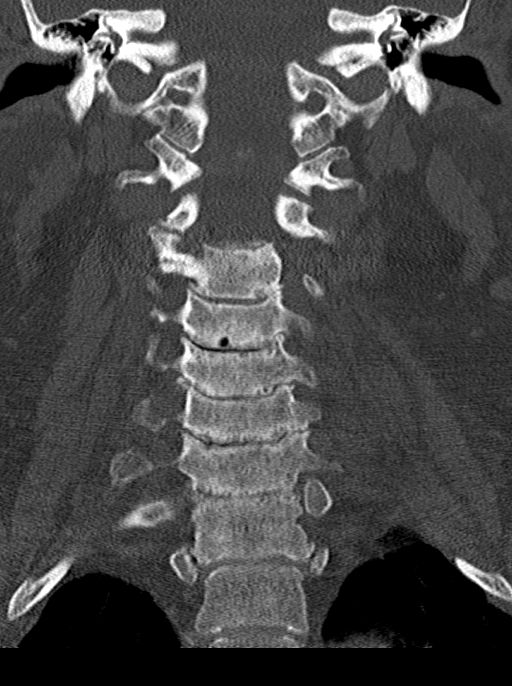
[im 44/73  bone]
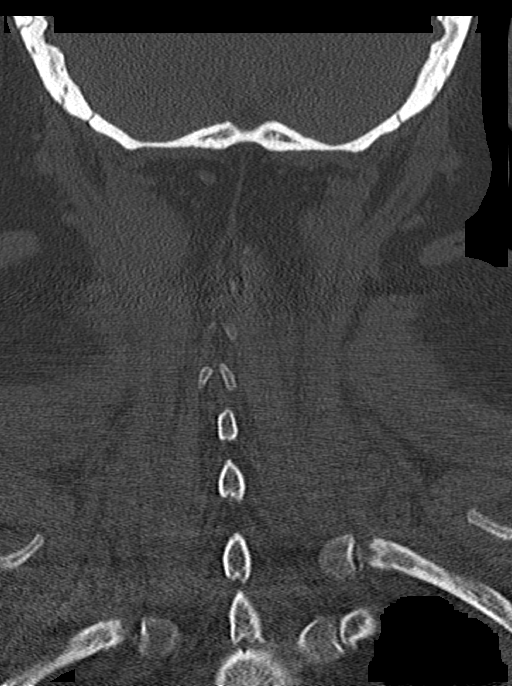

[Series 6: orthogonal axials · axial · 0.26mm/px · z∈[-310,-187]mm · 3 of 99 slices shown, 4 images]
[im 17/99  soft-tissue]
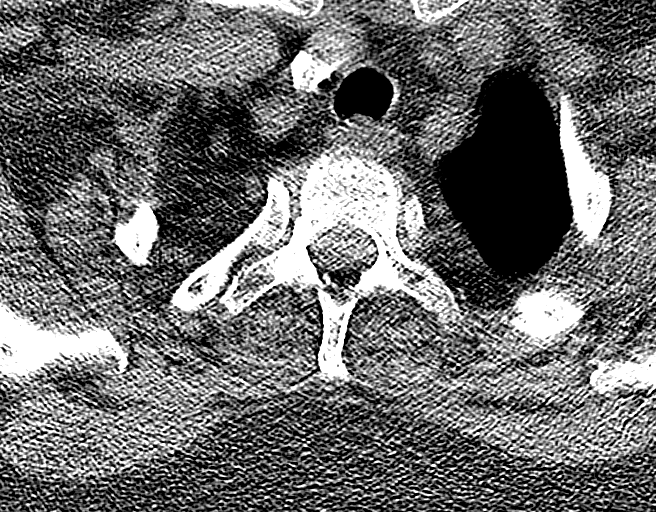
[im 17/99  bone]
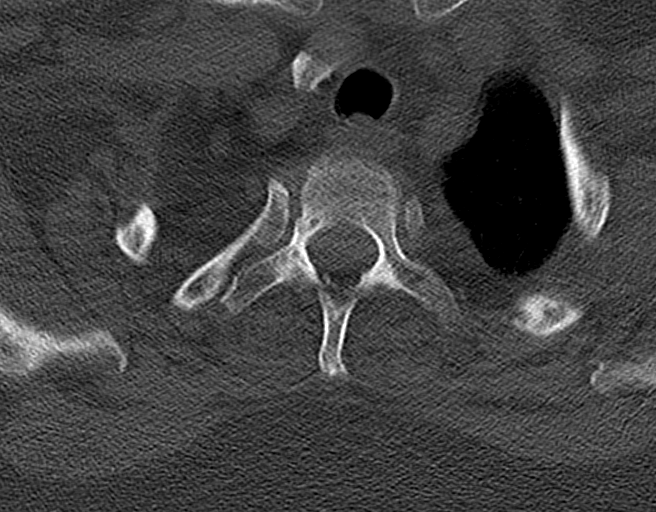
[im 50/99  bone]
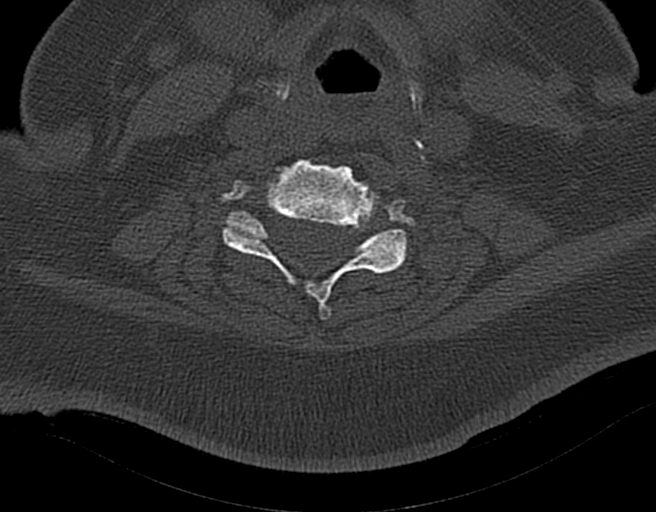
[im 82/99  bone]
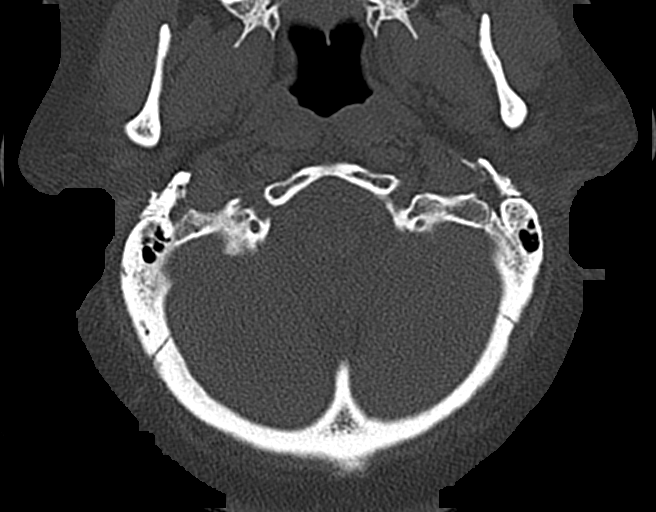

[11 of 33 positions shown; findings below may reference images not displayed]

FINDINGS: CT HEAD FINDINGS

Brain: No evidence of acute infarction, hemorrhage, hydrocephalus,
extra-axial collection or mass lesion/mass effect.

Signs of atrophy and chronic microvascular ischemic change in deep
white matter as before.

Vascular: No hyperdense vessel or unexpected calcification.

Skull: Normal. Negative for fracture or focal lesion.

Other: Preseptal swelling over the right orbit. See maxillofacial CT
for further detail. Dressing in place over the right orbit. Small
amounts of gas in the soft tissues likely in the eyelid and
supraorbital region likely reflecting laceration. Small metallic
foreign body superficial to the skin surface along the right cheek.

CT MAXILLOFACIAL FINDINGS

Osseous: No signs of fracture with preseptal swelling and laceration
as indicated above.

Orbits: Preseptal swelling and periorbital hematoma in the soft
tissues of the right face. No retro bulbar stranding.

Sinuses: Clear.

Soft tissues: Preseptal swelling and superficial, subcutaneous
hematoma associated with the injury to the right periorbital region.

CT CERVICAL SPINE FINDINGS

Alignment: Mild reversal of cervical lordosis in the upper cervical
spine, associated with degenerative changes. Not changed since prior
examination is from 6211

Skull base and vertebrae: No acute fracture. No primary bone lesion
or focal pathologic process.

Soft tissues and spinal canal: No prevertebral fluid or swelling. No
visible canal hematoma. Retropharyngeal course of carotid
vasculature similar to previous studies.

Disc levels: Multilevel degenerative change throughout the cervical
spine greatest at the midportion of the cervical spine but with near
complete disc space narrowing seen at multiple levels, C3-C4, C4-C5,
C5-C6 and C6-C7 associated with small anterior disc osteophyte
complexes and with posterior uncovertebral degenerative changes with
varying degrees of foraminal narrowing

Upper chest: Negative.

Other: None
IMPRESSION: 1. No CT evidence for acute intracranial process.
2. No CT evidence for acute traumatic fracture of the cervical
spine.
3. Multilevel degenerative change of the cervical spine.
4. Preseptal swelling and periorbital hematoma in the right face. No
evidence for underlying fracture. Small metallic foreign body
superficial to the skin surface along the right cheek.
5. Retropharyngeal course of carotid vasculature similar to prior
studies.

## 2021-10-26 ENCOUNTER — Emergency Department (HOSPITAL_BASED_OUTPATIENT_CLINIC_OR_DEPARTMENT_OTHER)
Admission: EM | Admit: 2021-10-26 | Discharge: 2021-10-26 | Disposition: A | Discharge status: Home / Self Care | Payer: Medicare Other | Attending: Emergency Medicine | Admitting: Emergency Medicine

## 2021-10-26 ENCOUNTER — Other Ambulatory Visit: Payer: Self-pay

## 2021-10-26 ENCOUNTER — Encounter (HOSPITAL_BASED_OUTPATIENT_CLINIC_OR_DEPARTMENT_OTHER): Payer: Self-pay | Admitting: Emergency Medicine

## 2021-10-26 ENCOUNTER — Emergency Department (HOSPITAL_BASED_OUTPATIENT_CLINIC_OR_DEPARTMENT_OTHER): Payer: Medicare Other

## 2021-10-26 DIAGNOSIS — Z20822 Contact with and (suspected) exposure to covid-19: Secondary | ICD-10-CM | POA: Diagnosis not present

## 2021-10-26 DIAGNOSIS — S99921A Unspecified injury of right foot, initial encounter: Secondary | ICD-10-CM | POA: Diagnosis present

## 2021-10-26 DIAGNOSIS — I10 Essential (primary) hypertension: Secondary | ICD-10-CM | POA: Diagnosis not present

## 2021-10-26 DIAGNOSIS — Z7982 Long term (current) use of aspirin: Secondary | ICD-10-CM | POA: Insufficient documentation

## 2021-10-26 DIAGNOSIS — S91351A Open bite, right foot, initial encounter: Secondary | ICD-10-CM | POA: Insufficient documentation

## 2021-10-26 DIAGNOSIS — W5501XA Bitten by cat, initial encounter: Secondary | ICD-10-CM

## 2021-10-26 DIAGNOSIS — Z79899 Other long term (current) drug therapy: Secondary | ICD-10-CM | POA: Diagnosis not present

## 2021-10-26 DIAGNOSIS — L03119 Cellulitis of unspecified part of limb: Secondary | ICD-10-CM

## 2021-10-26 DIAGNOSIS — L03115 Cellulitis of right lower limb: Secondary | ICD-10-CM | POA: Diagnosis not present

## 2021-10-26 HISTORY — DX: Disorder of thyroid, unspecified: E07.9

## 2021-10-26 LAB — CBC WITH DIFFERENTIAL/PLATELET
Abs Immature Granulocytes: 0.06 10*3/uL (ref 0.00–0.07)
Basophils Absolute: 0 10*3/uL (ref 0.0–0.1)
Basophils Relative: 0 %
Eosinophils Absolute: 0 10*3/uL (ref 0.0–0.5)
Eosinophils Relative: 0 %
HCT: 37.3 % (ref 36.0–46.0)
Hemoglobin: 12.8 g/dL (ref 12.0–15.0)
Immature Granulocytes: 1 %
Lymphocytes Relative: 11 %
Lymphs Abs: 1.3 10*3/uL (ref 0.7–4.0)
MCH: 32.7 pg (ref 26.0–34.0)
MCHC: 34.3 g/dL (ref 30.0–36.0)
MCV: 95.2 fL (ref 80.0–100.0)
Monocytes Absolute: 0.6 10*3/uL (ref 0.1–1.0)
Monocytes Relative: 5 %
Neutro Abs: 10.2 10*3/uL — ABNORMAL HIGH (ref 1.7–7.7)
Neutrophils Relative %: 83 %
Platelets: 228 10*3/uL (ref 150–400)
RBC: 3.92 MIL/uL (ref 3.87–5.11)
RDW: 14.9 % (ref 11.5–15.5)
WBC: 12.3 10*3/uL — ABNORMAL HIGH (ref 4.0–10.5)
nRBC: 0 % (ref 0.0–0.2)

## 2021-10-26 LAB — BASIC METABOLIC PANEL
Anion gap: 11 (ref 5–15)
BUN: 21 mg/dL (ref 8–23)
CO2: 25 mmol/L (ref 22–32)
Calcium: 9.1 mg/dL (ref 8.9–10.3)
Chloride: 96 mmol/L — ABNORMAL LOW (ref 98–111)
Creatinine, Ser: 1.3 mg/dL — ABNORMAL HIGH (ref 0.44–1.00)
GFR, Estimated: 41 mL/min — ABNORMAL LOW (ref >60)
Glucose, Bld: 129 mg/dL — ABNORMAL HIGH (ref 70–99)
Potassium: 3.6 mmol/L (ref 3.5–5.1)
Sodium: 132 mmol/L — ABNORMAL LOW (ref 135–145)

## 2021-10-26 LAB — RESP PANEL BY RT-PCR (FLU A&B, COVID) ARPGX2
Influenza A by PCR: NEGATIVE
Influenza B by PCR: NEGATIVE
SARS Coronavirus 2 by RT PCR: NEGATIVE

## 2021-10-26 MED ORDER — AMOXICILLIN-POT CLAVULANATE 875-125 MG PO TABS: 1.0000 | ORAL_TABLET | Freq: Two times a day (BID) | ORAL | 0 refills | Status: AC

## 2021-10-26 MED ORDER — AMOXICILLIN-POT CLAVULANATE 875-125 MG PO TABS
1.0000 | ORAL_TABLET | Freq: Once | ORAL | Status: AC
  Administered 2021-10-26: 1 via ORAL
  Filled 2021-10-26: qty 1

## 2021-10-26 MED ORDER — ACETAMINOPHEN 500 MG PO TABS: 1000.0000 mg | ORAL_TABLET | Freq: Four times a day (QID) | ORAL | 0 refills | Status: AC | PRN

## 2021-10-26 MED ORDER — SODIUM CHLORIDE 0.9 % IV BOLUS: 1000.0000 mL | Freq: Once | INTRAVENOUS | Status: DC

## 2021-10-26 MED ORDER — ROXICODONE 5 MG PO TABS: ORAL_TABLET | ORAL | 0 refills | Status: AC

## 2021-10-26 MED ORDER — HYDROCODONE-ACETAMINOPHEN 5-325 MG PO TABS
1.0000 | ORAL_TABLET | Freq: Once | ORAL | Status: AC
  Administered 2021-10-26: 1 via ORAL
  Filled 2021-10-26: qty 1

## 2021-10-26 NOTE — Discharge Instructions (Signed)
We evaluated you for your foot pain.  Your x-ray and CT scan did not show any fracture.  Your foot pain is likely due to an infection called cellulitis caused by your cat bite.  Please take the antibiotics we have prescribed.  Please return if your symptoms get worse or the redness spreads.  We have marked the redness with a pen.  Please take the antibiotics we have prescribed twice a day for 7 days.  Please follow up with your primary doctor sometime this week for recheck of your cellulitis.  For pain, please take 1000 mg of Tylenol every 6 hours for pain as needed.  You still have pain, please take 1/2 to 1 tablet of the 5 mg oxycodone.  This can make you drowsy so please be very careful and ambulate with assistance.  Please return to the emergency department if any of your symptoms worsen or the redness spreads, you develop fevers, vomiting, or any other concerning symptoms.

## 2021-10-26 NOTE — ED Provider Notes (Signed)
[facility] Provider Note  CSN: [id number] Arrival date & time: 10/26/21 1113  Chief Complaint(s) Foot Injury  HPI Madison Ayala is a 81 y.o. female presenting to the emergency department right foot pain.  Patient reports that she was scratched by her cat around 24 hours ago on her right foot.  She reports that as she was trying to get away from her cat she also twisted her foot and fell onto her right side.  Denies hitting her head, headache.  Denies any hip pain.  Denies knee pain.  Denies any neck or back pain.  She denies noticing any fevers or chills.  She reports the foot is throbbing   Past Medical History Past Medical History:  Diagnosis Date   GERD (gastroesophageal reflux disease)    Hypertension    Thyroid disease    There are no problems to display for this patient.  Home Medication(s) Prior to Admission medications   Medication Sig Start Date End Date Taking? Authorizing Provider  acetaminophen (TYLENOL) 500 MG tablet Take 2 tablets (1,000 mg total) by mouth every 6 (six) hours as needed for mild pain or moderate pain. 10/26/21  Yes Cristie Hem, MD  amoxicillin-clavulanate (AUGMENTIN) 875-125 MG tablet Take 1 tablet by mouth every 12 (twelve) hours. 10/26/21  Yes Cristie Hem, MD  ROXICODONE 5 MG immediate release tablet Take 1/2 to 1 tab every 6 hours as needed for pain 10/26/21  Yes Cristie Hem, MD  aspirin 325 MG EC tablet Take 325 mg by mouth at bedtime.    [provider]  Calcium Carbonate-Vitamin D (CALTRATE 600+D) 600-400 MG-UNIT per tablet Take 1 tablet by mouth 2 (two) times daily.    [provider]  Calcium-Vitamin D-Vitamin K (VIACTIV) 681-275-17 MG-UNT-MCG CHEW Chew 3 tablets by mouth daily.    [provider]  cyclobenzaprine (FLEXERIL) 10 MG tablet Take 10 mg by mouth 3 (three) times daily.    [provider]  fenofibrate 160 MG tablet Take 160 mg by mouth at  bedtime.    [provider]  furosemide (LASIX) 40 MG tablet Take 40 mg by mouth daily.    [provider]  levothyroxine (SYNTHROID, LEVOTHROID) 150 MCG tablet Take 150 mcg by mouth daily.    [provider]  lidocaine (LIDODERM) 5 % Place 1-3 patches onto the skin daily. Remove & Discard patch within 12 hours or as directed by MD    [provider]  lisinopril (PRINIVIL,ZESTRIL) 5 MG tablet Take 5 mg by mouth at bedtime.    [provider]  methadone (DOLOPHINE) 10 MG tablet Take 10 mg by mouth 2 (two) times daily.    [provider]  metoprolol tartrate (LOPRESSOR) 25 MG tablet Take 25 mg by mouth 2 (two) times daily.    [provider]  Multiple Vitamin (MULITIVITAMIN WITH MINERALS) TABS Take 1 tablet by mouth daily.    [provider]  omeprazole (PRILOSEC) 20 MG capsule Take 20 mg by mouth at bedtime.    [provider]  sertraline (ZOLOFT) 100 MG tablet Take 100 mg by mouth daily.    [provider]  temazepam (RESTORIL) 30 MG capsule Take 30 mg by mouth at bedtime.    [provider]  Past Surgical History Past Surgical History:  Procedure Laterality Date   ABDOMINAL HYSTERECTOMY     ABDOMINAL SURGERY     APPENDECTOMY     COLON SURGERY     Family History History reviewed. No pertinent family history.  Social History Social History   Tobacco Use   Smoking status: Never  Vaping Use   Vaping Use: Never used  Substance Use Topics   Alcohol use: No   Drug use: No   Allergies Patient has no known allergies.  Review of Systems Review of Systems  All other systems reviewed and are negative.   Physical Exam Vital Signs  I have reviewed the triage vital signs BP 113/77   Pulse 92   Temp 100 F (37.8 C) (Oral)   Resp (!) 21   Ht 5\' 3"  (1.6 m)    Wt 94.8 kg   SpO2 93%   BMI 37.02 kg/m  Physical Exam Vitals and nursing note reviewed.  Constitutional:      General: She is not in acute distress.    Appearance: She is well-developed.  HENT:     Head: Normocephalic and atraumatic.     Mouth/Throat:     Mouth: Mucous membranes are moist.  Eyes:     Pupils: Pupils are equal, round, and reactive to light.  Cardiovascular:     Rate and Rhythm: Normal rate and regular rhythm.     Heart sounds: No murmur heard. Pulmonary:     Effort: Pulmonary effort is normal. No respiratory distress.     Breath sounds: Normal breath sounds.  Abdominal:     General: Abdomen is flat.     Palpations: Abdomen is soft.     Tenderness: There is no abdominal tenderness.  Musculoskeletal:        General: No tenderness.     Right lower leg: No edema.     Left lower leg: No edema.     Comments: Full range of motion of the bilateral shoulders, elbows, wrists, hips, knees and ankles.  She has focal tenderness and erythema to the dorsum of the right foot with warmth.  This is at the site of cat bite.  She also has tenderness to the sole of the foot  Skin:    General: Skin is warm and dry.  Neurological:     General: No focal deficit present.     Mental Status: She is alert. Mental status is at baseline.  Psychiatric:        Mood and Affect: Mood normal.        Behavior: Behavior normal.     ED Results and Treatments Labs (all labs ordered are listed, but only abnormal results are displayed) Labs Reviewed  CBC WITH DIFFERENTIAL/PLATELET - Abnormal; Notable for the following components:      Result Value   WBC 12.3 (*)    Neutro Abs 10.2 (*)    All other components within normal limits  BASIC METABOLIC PANEL - Abnormal; Notable for the following components:   Sodium 132 (*)    Chloride 96 (*)    Glucose, Bld 129 (*)    Creatinine, Ser 1.30 (*)    GFR, Estimated 41 (*)    All other components within normal limits  RESP PANEL BY RT-PCR (FLU  A&B, COVID) ARPGX2  Radiology CT Foot Right Wo Contrast  Result Date: 10/26/2021 CLINICAL DATA:  Foot pain, stress fracture suspected, neg xray EXAM: CT OF THE RIGHT FOOT WITHOUT CONTRAST TECHNIQUE: Multidetector CT imaging of the right foot was performed according to the standard protocol. Multiplanar CT image reconstructions were also generated. RADIATION DOSE REDUCTION: This exam was performed according to the departmental dose-optimization program which includes automated exposure control, adjustment of the mA and/or kV according to patient size and/or use of iterative reconstruction technique. COMPARISON:  X-ray 10/26/2021 FINDINGS: Bones/Joint/Cartilage No acute fracture is seen. No dislocation. Mild degenerative changes, most notably at the first MTP joint. No erosion. No periosteal elevation. No suspicious lytic or sclerotic bone lesion. Ligaments Suboptimally assessed by CT. Muscles and Tendons No acute musculotendinous abnormality by CT. Soft tissues No ulceration.  No fluid collection.  No soft tissue gas. IMPRESSION: No acute osseous abnormality of the right foot. Electronically Signed   By: Duanne GuessNicholas  Plundo D.O.   On: 10/26/2021 12:51   DG Foot Complete Right  Result Date: 10/26/2021 CLINICAL DATA:  Trauma, fall EXAM: RIGHT FOOT COMPLETE - 3+ VIEW COMPARISON:  None Available. FINDINGS: No recent fracture or dislocation is seen. There are few smooth marginated calcifications adjacent to cuboid which may suggest accessory ossicles or residual change from previous soft tissue injury. Minimal bony spurs seen in first metatarsophalangeal joint. There is small linear calcification at the attachment of Achilles tendon to the calcaneus. Radiolucencies are seen in the shafts of proximal phalanges of second and third toes without break in the cortical margins. This may suggest benign  process such as bone cysts. IMPRESSION: No recent fracture or dislocation is seen. Few small smooth marginated calcifications adjacent to cuboid may suggest accessory ossicles or soft tissue calcification from previous injury. Radiolucencies are seen in the shafts of proximal phalanges of second and third toes with minimal cortical expansion without break in the cortical margins. This may suggest residual changes from previous injury or bone cysts. If there are any focal symptoms in this region, follow-up radiographic examination in few weeks may be considered. Electronically Signed   By: Ernie AvenaPalani  Rathinasamy M.D.   On: 10/26/2021 11:40    Pertinent labs & imaging results that were available during my care of the patient were reviewed by me and considered in my medical decision making (see MDM for details).  Medications Ordered in ED Medications  HYDROcodone-acetaminophen (NORCO/VICODIN) 5-325 MG per tablet 1 tablet (1 tablet Oral Given 10/26/21 1322)  amoxicillin-clavulanate (AUGMENTIN) 875-125 MG per tablet 1 tablet (1 tablet Oral Given 10/26/21 1412)                                                                                                                                     Procedures Procedures  (including critical care time)  Medical Decision Making / ED Course   MDM:  81 year old female presenting to the emergency department with foot pain.  Examination concerning for cellulitis of the dorsum of the right foot after cat bite.  Patient also has significant tenderness to the sole of her foot and reports twisting it and painful ambulation.  CT x-ray negative, will obtain CT scan to evaluate for occult fracture.  Will obtain basic labs.  Signs notable for borderline fever mild tachycardia  Clinical Course as of 10/26/21 1559  Sun Oct 26, 2021  1523 Labs with mild elevated creatinine, reviewed outside records appears at baseline. Also with mild elevation in WBC count.  CT scan  negative for occult Lisfranc injury.  Suspect symptoms are due to recent cat bite and cellulitis on the dorsum of the foot.  Patient was able to ambulate without difficulty with a walker.  COVID test negative.  Patient reports she feels comfortable going home and has help at home.  Provided with walker. Will discharge patient to home. All questions answered. Patient comfortable with plan of discharge. Return precautions discussed with patient and specified on the after visit summary.  [WS]    Clinical Course User Index [WS] Lonell Grandchild, MD     Additional history obtained: -Additional history obtained from ems -External records from outside source obtained and reviewed including: Chart review including previous notes, labs, imaging, consultation notes including CKD visit 7/1   Lab Tests: -I ordered, reviewed, and interpreted labs.   The pertinent results include:   Labs Reviewed  CBC WITH DIFFERENTIAL/PLATELET - Abnormal; Notable for the following components:      Result Value   WBC 12.3 (*)    Neutro Abs 10.2 (*)    All other components within normal limits  BASIC METABOLIC PANEL - Abnormal; Notable for the following components:   Sodium 132 (*)    Chloride 96 (*)    Glucose, Bld 129 (*)    Creatinine, Ser 1.30 (*)    GFR, Estimated 41 (*)    All other components within normal limits  RESP PANEL BY RT-PCR (FLU A&B, COVID) ARPGX2    Notable for elevated WBC count   Imaging Studies ordered: I ordered imaging studies including CT foot/XR foot On my interpretation imaging demonstrates no fracture I independently visualized and interpreted imaging. I agree with the radiologist interpretation   Medicines ordered and prescription drug management: Meds ordered this encounter  Medications   DISCONTD: sodium chloride 0.9 % bolus 1,000 mL   HYDROcodone-acetaminophen (NORCO/VICODIN) 5-325 MG per tablet 1 tablet   amoxicillin-clavulanate (AUGMENTIN) 875-125 MG per tablet 1  tablet   amoxicillin-clavulanate (AUGMENTIN) 875-125 MG tablet    Sig: Take 1 tablet by mouth every 12 (twelve) hours.    Dispense:  14 tablet    Refill:  0   ROXICODONE 5 MG immediate release tablet    Sig: Take 1/2 to 1 tab every 6 hours as needed for pain    Dispense:  12 tablet    Refill:  0   acetaminophen (TYLENOL) 500 MG tablet    Sig: Take 2 tablets (1,000 mg total) by mouth every 6 (six) hours as needed for mild pain or moderate pain.    Dispense:  30 tablet    Refill:  0    -I have reviewed the patients home medicines and have made adjustments as needed  Cardiac Monitoring: The patient was maintained on a cardiac monitor.  I personally viewed and interpreted the cardiac monitored which showed an underlying rhythm of: NSR  Social Determinants of Health:  Diagnosis or treatment significantly limited by social determinants of  health: lives alone   Reevaluation: After the interventions noted above, I reevaluated the patient and found that they have improved  Co morbidities that complicate the patient evaluation  Past Medical History:  Diagnosis Date   GERD (gastroesophageal reflux disease)    Hypertension    Thyroid disease       Dispostion: Disposition decision including need for hospitalization was considered, and patient discharged from emergency department.    Final Clinical Impression(s) / ED Diagnoses Final diagnoses:  Cat bite of right foot, initial encounter  Cellulitis of foot     This chart was dictated using voice recognition software.  Despite best efforts to proofread,  errors can occur which can change the documentation meaning.    Lonell Grandchild, MD 10/26/21 [phone]

## 2021-10-26 NOTE — ED Notes (Signed)
PIV start attempted x2, unsuccessful.  2nd RN requested to attempt. EDP made aware.

## 2021-10-26 NOTE — ED Notes (Signed)
D/c paperwork reviewed with pt, including prescriptions. Pt reports that she has a ride to come pick her up, agreeable to wait in lobby.  Pt wheeled to ED lobby by NT, NAD.

## 2021-10-26 NOTE — ED Triage Notes (Addendum)
Tripped and fell over cat tyesterday hurt rt foot ,  swollen and  has scratch to rt foot also foot is swollen  and tender to touch pt arrives via EMS to ER , has been hobbling with cane states is dizzy but has been that way for a year

## 2023-06-28 ENCOUNTER — Emergency Department (HOSPITAL_BASED_OUTPATIENT_CLINIC_OR_DEPARTMENT_OTHER)

## 2023-06-28 ENCOUNTER — Emergency Department (HOSPITAL_BASED_OUTPATIENT_CLINIC_OR_DEPARTMENT_OTHER)
Admission: EM | Admit: 2023-06-28 | Discharge: 2023-06-28 | Disposition: A | Discharge status: Home / Self Care | Attending: Emergency Medicine | Admitting: Emergency Medicine

## 2023-06-28 ENCOUNTER — Other Ambulatory Visit: Payer: Self-pay

## 2023-06-28 ENCOUNTER — Encounter (HOSPITAL_BASED_OUTPATIENT_CLINIC_OR_DEPARTMENT_OTHER): Payer: Self-pay | Admitting: *Deleted

## 2023-06-28 DIAGNOSIS — I10 Essential (primary) hypertension: Secondary | ICD-10-CM | POA: Insufficient documentation

## 2023-06-28 DIAGNOSIS — W06XXXA Fall from bed, initial encounter: Secondary | ICD-10-CM | POA: Insufficient documentation

## 2023-06-28 DIAGNOSIS — W19XXXA Unspecified fall, initial encounter: Secondary | ICD-10-CM

## 2023-06-28 DIAGNOSIS — M25512 Pain in left shoulder: Secondary | ICD-10-CM | POA: Diagnosis not present

## 2023-06-28 DIAGNOSIS — S0990XA Unspecified injury of head, initial encounter: Secondary | ICD-10-CM

## 2023-06-28 DIAGNOSIS — S0101XA Laceration without foreign body of scalp, initial encounter: Secondary | ICD-10-CM | POA: Insufficient documentation

## 2023-06-28 NOTE — ED Notes (Signed)
 Direct message to EDP regarding pt, await orders for imaging.

## 2023-06-28 NOTE — ED Provider Notes (Signed)
 Emergency Department Provider Note   I have reviewed the triage vital signs and the nursing notes.   HISTORY  Chief Complaint Fall   HPI Madison Ayala is a 83 y.o. female with past history reviewed below presents the emergency department for evaluation of fall.  She fell while getting out of her bed last night and struck her head on the nightstand.  She sustained a laceration to the left parietal scalp which she cleaned.  She got back in bed and went to sleep.  She woke up and felt more wobbly in the morning with some fatigue.  She took an aspirin started to feel somewhat better but ultimately this afternoon decided to present to the ED for evaluation.  No numbness or weakness.  No chest pain or shortness of breath.  No pain in the arms or legs.   Past Medical History:  Diagnosis Date   GERD (gastroesophageal reflux disease)    Hypertension    Thyroid disease     Review of Systems  Constitutional: No fever/chills Cardiovascular: Denies chest pain. Respiratory: Denies shortness of breath. Gastrointestinal: No abdominal pain.  No nausea, no vomiting.  Skin: Negative for rash. Neurological: Negative for focal weakness or numbness. Positive HA.    ____________________________________________   PHYSICAL EXAM:  VITAL SIGNS: ED Triage Vitals  Encounter Vitals Group     BP 06/28/23 1141 (!) 145/89     Pulse Rate 06/28/23 1141 (!) 101     Resp 06/28/23 1141 16     Temp 06/28/23 1141 98 F (36.7 C)     Temp Source 06/28/23 1141 Oral     SpO2 06/28/23 1141 99 %    Constitutional: Alert and oriented. Well appearing and in no acute distress. Eyes: Conjunctivae are normal. PERRL. EOMI. Head: 3 cm laceration to the left parietal scalp.  This is in the early stages of healing, well-approximated, not bleeding.  Nose: No congestion/rhinnorhea. Mouth/Throat: Mucous membranes are moist. Neck: No stridor.  No cervical spine tenderness to palpation. Cardiovascular: Normal  rate, regular rhythm. Good peripheral circulation. Grossly normal heart sounds.   Respiratory: Normal respiratory effort.  No retractions. Lungs CTAB. Gastrointestinal: Soft and nontender. No distention.  Musculoskeletal: No lower extremity tenderness nor edema. No gross deformities of extremities. Neurologic:  Normal speech and language. No gross focal neurologic deficits are appreciated.  Skin:  Skin is warm, dry and intact. No rash noted.    ____________________________________________  RADIOLOGY  DG Shoulder Left Result Date: 06/28/2023 CLINICAL DATA:  Status post fall with left shoulder injury EXAM: LEFT SHOULDER - 3 VIEW COMPARISON:  None Available. FINDINGS: There is no evidence of fracture or dislocation. Degenerative changes of the left shoulder. Soft tissues are unremarkable. IMPRESSION: 1. No acute fracture or dislocation. 2. Degenerative changes of the left shoulder. Electronically Signed   By: Limin  Xu M.D.   On: 06/28/2023 13:22   CT Head Wo Contrast Result Date: 06/28/2023 CLINICAL DATA:  Head trauma, neck trauma. Frequent falls, fall out of bed last night with head strike. Pain on left side of head. EXAM: CT HEAD WITHOUT CONTRAST CT CERVICAL SPINE WITHOUT CONTRAST TECHNIQUE: Multidetector CT imaging of the head and cervical spine was performed following the standard protocol without intravenous contrast. Multiplanar CT image reconstructions of the cervical spine were also generated. RADIATION DOSE REDUCTION: This exam was performed according to the departmental dose-optimization program which includes automated exposure control, adjustment of the mA and/or kV according to patient size and/or use of iterative reconstruction  technique. COMPARISON:  03/22/2019. FINDINGS: CT HEAD FINDINGS Brain: No acute intracranial hemorrhage. No CT evidence of acute infarct. Similar encephalomalacia in the right frontal lobe. 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 Nonspecific  hypoattenuation in the periventricular and subcortical white matter favored to reflect chronic microvascular ischemic changes. No edema, mass effect, or midline shift. The basilar cisterns are patent. Ventricles: Mild ex vacuo dilatation of the right frontal horn. No hydrocephalus. Vascular: Atherosclerotic calcifications of the carotid siphons. No hyperdense vessel. Skull: No acute or aggressive finding. Orbits: Orbits are symmetric. Sinuses: The visualized paranasal sinuses are clear. Other: Mastoid air cells are clear. CT CERVICAL SPINE FINDINGS Alignment: Straightening of the normal cervical lordosis. Trace anterolisthesis of C2 on C3 with additional trace retrolisthesis of C5 on C6. No facet subluxation or dislocation. Skull base and vertebrae: No compression fracture or displaced fracture in the cervical spine. There is possible partial fusion of the C4 and C5 vertebral bodies. No suspicious osseous lesion. Soft tissues and spinal canal: No prevertebral fluid or swelling. No visible canal hematoma. Disc levels: Significant intervertebral disc space narrowing at multiple levels. Disc osteophyte complexes most pronounced at C5-6. Mild spinal canal stenosis at C5-6. Facet arthrosis and uncovertebral hypertrophy at multiple levels. Foraminal narrowing throughout the cervical spine Upper chest: Negative. Other: None. IMPRESSION: No CT evidence of acute intracranial abnormality. No acute fracture or traumatic malalignment of the cervical spine. 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 Electronically Signed   By: Denny Flack M.D.   On: 06/28/2023 12:49   CT Cervical Spine Wo Contrast Result Date: 06/28/2023 CLINICAL DATA:  Head trauma, neck trauma. Frequent falls, fall out of bed last night with head strike. Pain on left side of head. EXAM: CT HEAD WITHOUT CONTRAST CT CERVICAL SPINE WITHOUT CONTRAST TECHNIQUE: Multidetector CT imaging of the head and cervical spine was performed following the standard  protocol without intravenous contrast. Multiplanar CT image reconstructions of the cervical spine were also generated. RADIATION DOSE REDUCTION: This exam was performed according to the departmental dose-optimization program which includes automated exposure control, adjustment of the mA and/or kV according to patient size and/or use of iterative reconstruction technique. COMPARISON:  03/22/2019. FINDINGS: CT HEAD FINDINGS Brain: No acute intracranial hemorrhage. No CT evidence of acute infarct. Similar encephalomalacia in the right frontal lobe. 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 Nonspecific hypoattenuation in the periventricular and subcortical white matter favored to reflect chronic microvascular ischemic changes. No edema, mass effect, or midline shift. The basilar cisterns are patent. Ventricles: Mild ex vacuo dilatation of the right frontal horn. No hydrocephalus. Vascular: Atherosclerotic calcifications of the carotid siphons. No hyperdense vessel. Skull: No acute or aggressive finding. Orbits: Orbits are symmetric. Sinuses: The visualized paranasal sinuses are clear. Other: Mastoid air cells are clear. CT CERVICAL SPINE FINDINGS Alignment: Straightening of the normal cervical lordosis. Trace anterolisthesis of C2 on C3 with additional trace retrolisthesis of C5 on C6. No facet subluxation or dislocation. Skull base and vertebrae: No compression fracture or displaced fracture in the cervical spine. There is possible partial fusion of the C4 and C5 vertebral bodies. No suspicious osseous lesion. Soft tissues and spinal canal: No prevertebral fluid or swelling. No visible canal hematoma. Disc levels: Significant intervertebral disc space narrowing at multiple levels. Disc osteophyte complexes most pronounced at C5-6. Mild spinal canal stenosis at C5-6. Facet arthrosis and uncovertebral hypertrophy at multiple levels. Foraminal narrowing throughout the cervical spine Upper chest: Negative.  Other: None. IMPRESSION: No CT evidence of  acute intracranial abnormality. No acute fracture or traumatic malalignment of the cervical spine. 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 Electronically Signed   By: Denny Flack M.D.   On: 06/28/2023 12:49    ____________________________________________   PROCEDURES  Procedure(s) performed:   Procedures  None  ____________________________________________   INITIAL IMPRESSION / ASSESSMENT AND PLAN / ED COURSE  Pertinent labs & imaging results that were available during my care of the patient were reviewed by me and considered in my medical decision making (see chart for details).   This patient is Presenting for Evaluation of head injury, which does require a range of treatment options, and is a complaint that involves a high risk of morbidity and mortality.  The Differential Diagnoses includes subdural hematoma, epidural hematoma, acute concussion, traumatic subarachnoid hemorrhage, cerebral contusions, etc.   Radiologic Tests Ordered, included CT head. I independently interpreted the images and agree with radiology interpretation.   Medical Decision Making: Summary:  The patient presents emergency department for evaluation after a fall last night.  She presents nearly 18 hours after the fall with a scalp laceration which she has cleaned.  This is well-approximated and without bleeding.  At this point, discussed that I would allow this to heal by secondary intention rather than opening back up and applying staples.  This is well within her hairline.  She cleaned this at home last night.  She is up and ambulatory here.  We had a discussion regarding getting some sort of alert bracelet/necklace or watch where she can call for help.  Fortunately, she was able to get up under her own power last night.  She has neighbors who check on her occasionally.  Last night's fall appears to have been mechanical.  Defer further lab workup.  Vital  signs are normal.  No focal deficits to suspect stroke.  Patient's presentation is most consistent with acute presentation with potential threat to life or bodily function.   Disposition: discharge  ____________________________________________  FINAL CLINICAL IMPRESSION(S) / ED DIAGNOSES  Final diagnoses:  Fall, initial encounter  Injury of head, initial encounter  Laceration of scalp, initial encounter  Acute pain of left shoulder    Note:  This document was prepared using Dragon voice recognition software and may include unintentional dictation errors.  Abby Hocking, MD, [facility]    Tynlee Bayle, Shereen Dike, MD 06/29/23 [phone]

## 2023-06-28 NOTE — Discharge Instructions (Signed)
 You were seen in the Emergency Department (ED) today for a head injury.  Your CT scan did not show any bruising or bleeding. It is possible to develop at concussion after a fall like this.   Symptoms to expect from a concussion include nausea, mild to moderate headache, difficulty concentrating or sleeping, and mild lightheadedness.  These symptoms should improve over the next few days to weeks, but it may take many weeks before you feel back to normal.  Return to the emergency department or follow-up with your primary care doctor if your symptoms are not improving over this time.  Signs of a more serious head injury include vomiting, severe headache, excessive sleepiness or confusion, and weakness or numbness in your face, arms or legs.  Return immediately to the Emergency Department if you experience any of these more concerning symptoms.    Rest, avoid strenuous physical or mental activity, and avoid activities that could potentially result in another head injury until all your symptoms from this head injury are completely resolved for at least 2-3 weeks.  You may take ibuprofen or acetaminophen  over the counter according to label instructions for mild headache or scalp soreness.

## 2023-06-28 NOTE — ED Triage Notes (Signed)
 Pt reports that she has been having frequent falls and last pm she fell while getting out of bed and struck her head on the nightstand.  No LOC.  Pt states that she has soreness to the ledt side of her head, she had a fair amount of bleeding last pm and she reports that she was feeling wobbly this am.  Pt is on aspirin.
